# Patient Record
Sex: Female | Born: 1945 | Race: White | Hispanic: No | Marital: Married | Smoking: Never smoker
Health system: Southern US, Community
[De-identification: ages and names within clinical notes are randomized; demographics above are authoritative.]

## PROBLEM LIST (undated history)

## (undated) DIAGNOSIS — E785 Hyperlipidemia, unspecified: Secondary | ICD-10-CM

## (undated) DIAGNOSIS — D649 Anemia, unspecified: Secondary | ICD-10-CM

## (undated) DIAGNOSIS — E119 Type 2 diabetes mellitus without complications: Secondary | ICD-10-CM

## (undated) HISTORY — DX: Hyperlipidemia, unspecified: E78.5

## (undated) HISTORY — DX: Type 2 diabetes mellitus without complications: E11.9

## (undated) HISTORY — DX: Anemia, unspecified: D64.9

---

## 1984-04-13 HISTORY — PX: ABDOMINAL HYSTERECTOMY: SHX81

## 1984-05-10 DIAGNOSIS — O24419 Gestational diabetes mellitus in pregnancy, unspecified control: Secondary | ICD-10-CM

## 1989-08-13 HISTORY — PX: CERVICAL DISC SURGERY: SHX588

## 1996-10-14 HISTORY — PX: LAPAROSCOPIC CHOLECYSTECTOMY: SUR755

## 2002-08-05 ENCOUNTER — Other Ambulatory Visit: Admission: RE | Admit: 2002-08-05 | Discharge: 2002-08-05 | Payer: Self-pay | Admitting: Obstetrics and Gynecology

## 2002-08-19 ENCOUNTER — Encounter: Admission: RE | Admit: 2002-08-19 | Discharge: 2002-11-17 | Payer: Self-pay | Admitting: Internal Medicine

## 2006-09-13 ENCOUNTER — Encounter: Admission: RE | Admit: 2006-09-13 | Discharge: 2006-09-13 | Payer: Self-pay | Admitting: Internal Medicine

## 2009-02-03 IMAGING — CR DG HIP COMPLETE 2+V*R*
2 series · 2 of 2 positions shown · non-contrast
Comparison: None.

CLINICAL DATA: Pain six months - no known injury.
 RIGHT HIP - 2 VIEW:

[view not recorded (1 of 2)]
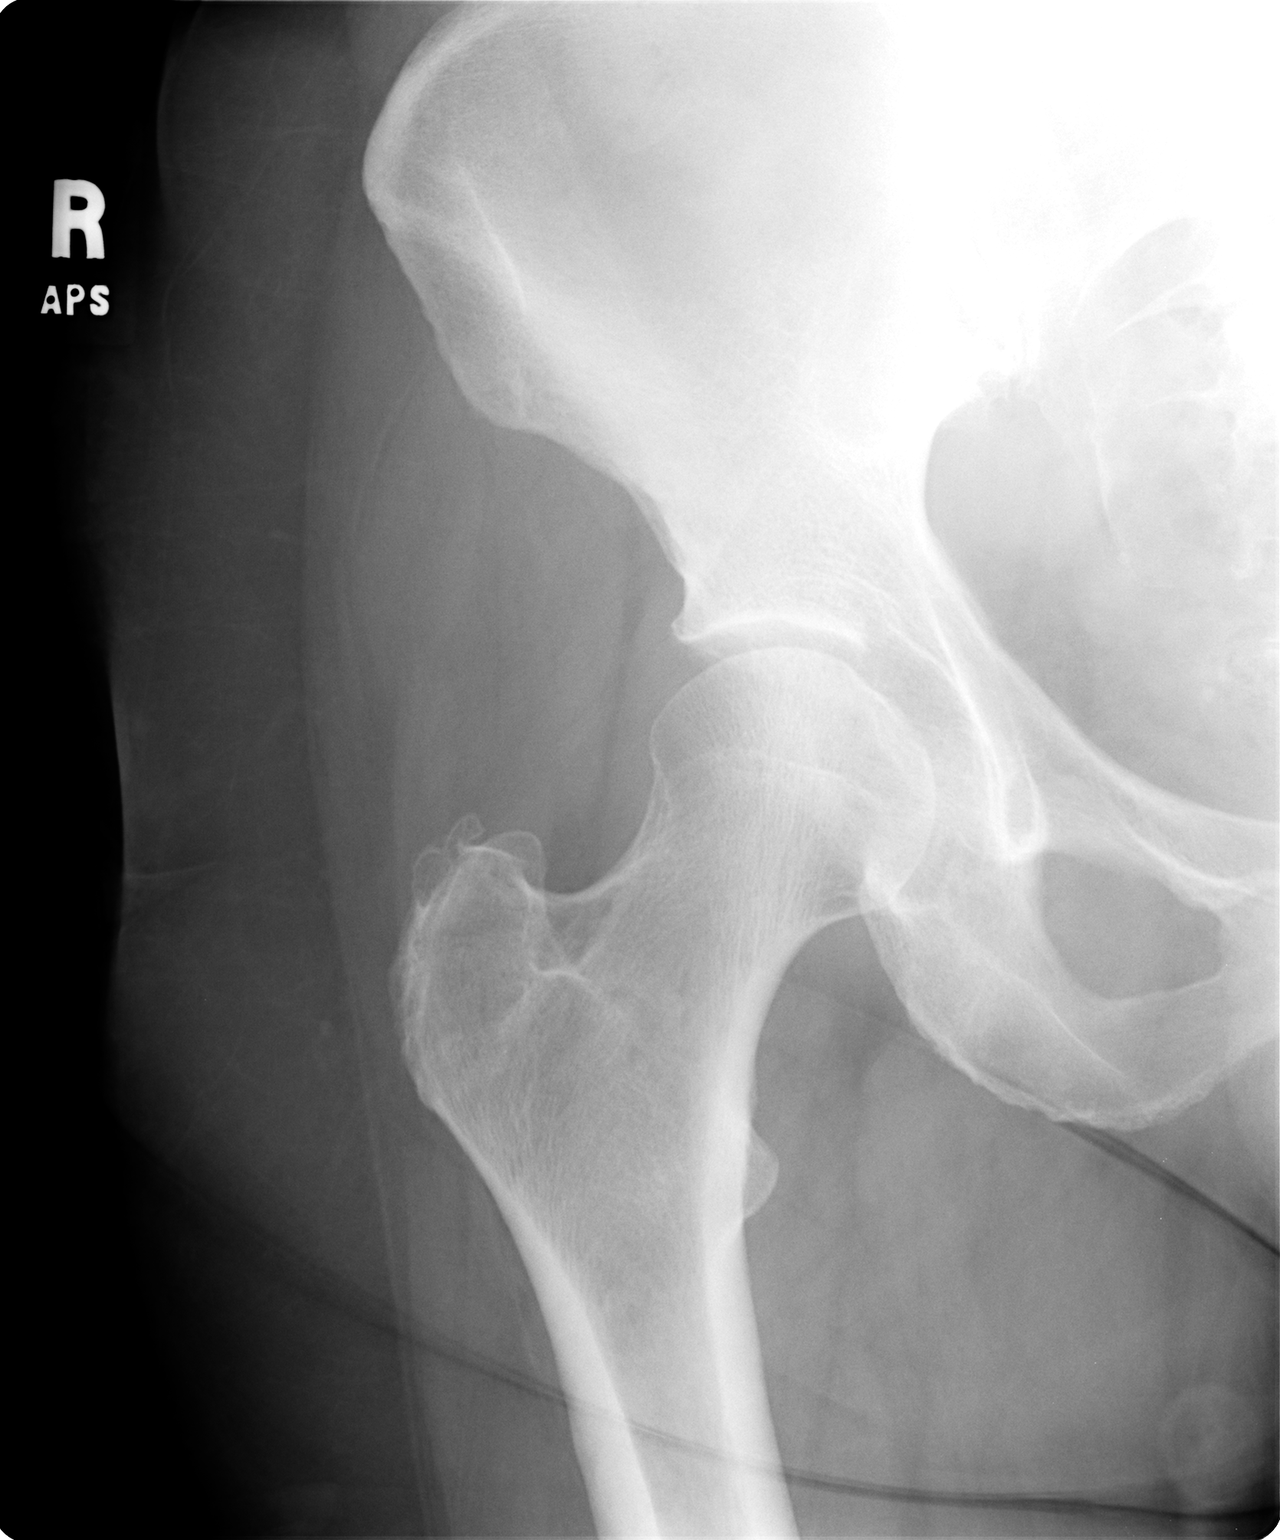

[view not recorded (2 of 2)]
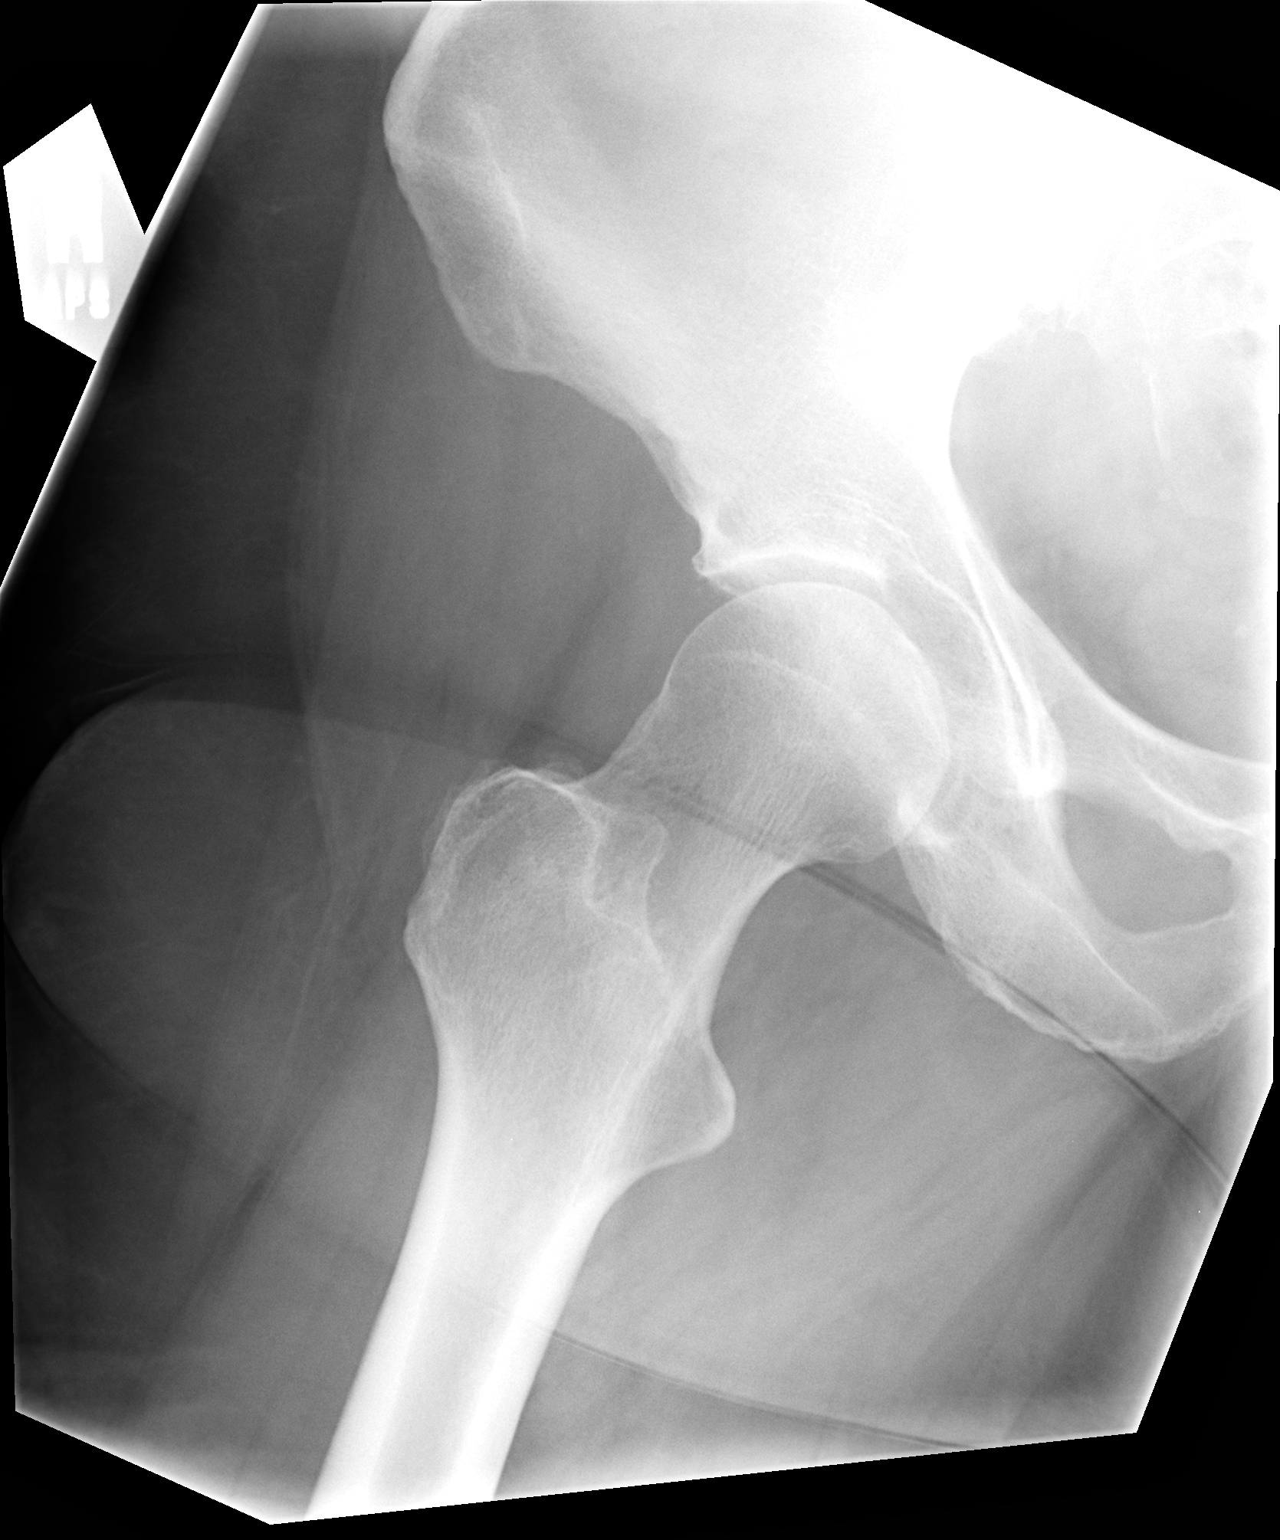

[2 of 2 positions shown; findings below may reference images not displayed]

FINDINGS: The hip is normal.  There are mild degenerative changes of the right SI joint.  No bone lesions evident.
IMPRESSION: 1.  The right hip is radiographically normal. 
 2.  Mild degenerative changes of the SI joint.

## 2013-10-09 ENCOUNTER — Encounter: Payer: Self-pay | Admitting: Nurse Practitioner

## 2013-11-04 ENCOUNTER — Telehealth: Payer: Self-pay | Admitting: Nurse Practitioner

## 2013-11-04 NOTE — Telephone Encounter (Signed)
Left message regarding appointment 11/06/13 has been canceled.

## 2013-11-06 ENCOUNTER — Encounter: Payer: Self-pay | Admitting: Nurse Practitioner

## 2014-04-08 ENCOUNTER — Ambulatory Visit: Payer: Self-pay | Admitting: Podiatrist

## 2014-04-29 ENCOUNTER — Ambulatory Visit (INDEPENDENT_AMBULATORY_CARE_PROVIDER_SITE_OTHER): Payer: Medicare Other | Admitting: Podiatrist

## 2014-04-29 ENCOUNTER — Encounter: Payer: Self-pay | Admitting: Podiatrist

## 2014-04-29 VITALS — BP 142/76 | HR 86 | Resp 12

## 2014-04-29 DIAGNOSIS — B351 Tinea unguium: Secondary | ICD-10-CM

## 2014-04-29 DIAGNOSIS — Z79899 Other long term (current) drug therapy: Secondary | ICD-10-CM

## 2014-04-29 MED ORDER — TERBINAFINE HCL 250 MG PO TABS: 250.0000 mg | ORAL_TABLET | Freq: Every day | ORAL | Status: DC

## 2014-04-29 NOTE — Patient Instructions (Signed)

## 2014-04-29 NOTE — Progress Notes (Signed)
   Subjective:    Patient ID: Kathryn Parker, female    DOB: [birth year], 69 y.o.   MRN: [id number]  HPI PT STATED B/L GREAT TOENAIL ARE THICK AND BEEN PAINFUL FOR 3 YEARS. PT HAVE HISTORY INJURING THE TOENAILS AND THEY BOTH FELL OFF BUT GREW BACK THICK AND GET AGGRAVATED BY WEARING SHOES. TRIED NO TREATMENT.   Review of Systems  Musculoskeletal: Positive for back pain.  All other systems reviewed and are negative.      Objective:   Physical Exam Patient is awake, alert, and oriented x 3.  In no acute distress.  Vascular status is intact with palpable pedal pulses at 2/4 DP and PT bilateral and capillary refill time within normal limits. Neurological sensation is also intact bilaterally via Semmes Weinstein monofilament at 5/5 sites. Light touch, vibratory sensation, Achilles tendon reflex is intact. Dermatological exam reveals skin color, turger and texture as normal. No open lesions present.  Musculature intact with dorsiflexion, plantarflexion, inversion, eversion.  Bilateral hallux naills are severely thick, discolored, dystrophic, painful and clinically mycotic and unresponsive to conservative treatments.    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  Recommended a non premanent removal of bilateral hallux nails and that patient agreed.  Bilateral hallux was prepped with alcohol and a 1 to 1 mix of 0.5% marcaine plain and 2% lidocaine plain was administered in a digital block fashion.  The toe was then prepped with betadine solution and exsanguinated.  The offending nail was then carefully remove.  The area was then cleansed well and antibiotic ointment and a dry sterile dressing was applied.  The patient was dispensed instructions for aftercare. She will start on Lamisil and blood work forms were dispensed.  I also [facility]  Will follow up in a month to check on  the nails and the medication. If any concerns arise in the meantime she is instructed to call.

## 2014-05-01 LAB — HEPATIC FUNCTION PANEL
ALBUMIN: 4.1 g/dL (ref 3.5–5.2)
ALT: 13 U/L (ref 0–35)
AST: 14 U/L (ref 0–37)
Alkaline Phosphatase: 72 U/L (ref 39–117)
BILIRUBIN INDIRECT: 0.6 mg/dL (ref 0.2–1.2)
Bilirubin, Direct: 0.1 mg/dL (ref 0.0–0.3)
TOTAL PROTEIN: 6.9 g/dL (ref 6.0–8.3)
Total Bilirubin: 0.7 mg/dL (ref 0.2–1.2)

## 2014-05-05 ENCOUNTER — Telehealth: Payer: Self-pay

## 2014-05-05 NOTE — Telephone Encounter (Signed)
Left message for pt to call back regarding lab results, her hepatic functions are good and she is cleared to start taking the lamisil

## 2014-05-06 ENCOUNTER — Telehealth: Payer: Self-pay | Admitting: *Deleted

## 2014-05-06 NOTE — Telephone Encounter (Signed)
Pt request lab results.  Dr. Irving ShowsEgerton states the liver function results are within normal ranges and pt may begin the Lamisil.  I informed pt of the results and answered her question concerning pain management of toes after toenail removal procedures and after care, with antiinflammatory medicine and antibacterial cleanses until the areas get a dry hard scab without drainage.  Pt states understanding.

## 2014-06-02 ENCOUNTER — Telehealth: Payer: Self-pay | Admitting: *Deleted

## 2014-06-02 NOTE — Telephone Encounter (Addendum)
Left message informing pt I was calling concerning her labs and to please call our office.  Dr. Irving ShowsEgerton states pt's toenail culture was positive for fungus and her Hepatitic Function test were within normal limits so she could begin the Lamisil as ordered.  Pt called for lab results, I informed her of Dr. Faylene MillionEgerton's orders.

## 2014-11-25 ENCOUNTER — Encounter: Payer: Self-pay | Admitting: Nurse Practitioner

## 2014-11-25 ENCOUNTER — Ambulatory Visit (INDEPENDENT_AMBULATORY_CARE_PROVIDER_SITE_OTHER): Payer: Medicare Other | Admitting: Nurse Practitioner

## 2014-11-25 VITALS — BP 144/80 | HR 60 | Ht 64.5 in | Wt 191.0 lb

## 2014-11-25 DIAGNOSIS — Z0000 Encounter for general adult medical examination without abnormal findings: Secondary | ICD-10-CM

## 2014-11-25 DIAGNOSIS — Z01419 Encounter for gynecological examination (general) (routine) without abnormal findings: Secondary | ICD-10-CM | POA: Diagnosis not present

## 2014-11-25 NOTE — Progress Notes (Signed)
Patient ID: Kathryn Parker, female   DOB: [birth year], 69 y.o.   MRN: [id number] 69 y.o. G71P2002 Married  Caucasian Fe here for NGYN annual exam.  She has a lapse of GYN care for several years.  She is a former pt. and over 3-4 yrs since last seen.  She is now retired from [employer] since January.  They have 3 grandchildren and plans to retire in [place].  Getting their house ready to sell.  Patient's last menstrual period was 07/15/1983 (approximate).          Sexually active: Yes.    The current method of family planning is status post hysterectomy.    Exercising: Yes.    walking Smoker:  no  Health Maintenance: Pap:  Hysterectomy, last pap in paper chart in storage MMG:  >3 years order is given Colonoscopy:  2014, polyps, repeat in 2 years, has received recall letter, IFOB given by Dr. Timothy Lasso 11/19/14 BMD:   ? Order is given TDaP:  2014 Labs:  Dr. Timothy Lasso, have copy of reports and they will be scanned   reports that she has never smoked. She has never used smokeless tobacco. She reports that she does not drink alcohol or use illicit drugs.  Past Medical History  Diagnosis Date  . Diabetes mellitus without complication (HCC)   . Anemia 1985, 1986    heavy menses, hsyterectomy   . Hyperlipidemia     Past Surgical History  Procedure Laterality Date  . Laparoscopic cholecystectomy  10/1996  . Cervical disc surgery  08/1989    C4-C5 Fusion  . Abdominal hysterectomy  04/1984    with c-section    Current Outpatient Prescriptions  Medication Sig Dispense Refill  . aspirin 81 MG EC tablet Take 81 mg by mouth daily. Swallow whole.    . ramipril (ALTACE) 2.5 MG capsule Take 1 capsule by mouth daily.     No current facility-administered medications for this visit.    Family History  Problem Relation Age of Onset  . Cancer Mother     oral, smoker  . COPD Mother   . Hypertension Mother   . Heart attack Father     late 4's and 45  . Osteoporosis Paternal Grandmother     ROS:   Pertinent items are noted in HPI.  Otherwise, a comprehensive ROS was negative.  Exam:   BP 142/86 mmHg  Pulse 60  Ht 5' 4.5" (1.638 m)  Wt 191 lb (86.637 kg)  BMI 32.29 kg/m2  LMP 07/15/1983 (Approximate) Height: 5' 4.5" (163.8 cm) Ht Readings from Last 3 Encounters:  11/25/14 5' 4.5" (1.638 m)    General appearance: alert, cooperative and appears stated age Head: Normocephalic, without obvious abnormality, atraumatic Neck: no adenopathy, supple, symmetrical, trachea midline and thyroid normal to inspection and palpation Lungs: clear to auscultation bilaterally Breasts: normal appearance, no masses or tenderness Heart: regular rate and rhythm Abdomen: soft, non-tender; no masses,  no organomegaly Extremities: extremities normal, atraumatic, no cyanosis or edema Skin: Skin color, texture, turgor normal. No rashes or lesions Lymph nodes: Cervical, supraclavicular, and axillary nodes normal. No abnormal inguinal nodes palpated Neurologic: Grossly normal   Pelvic: External genitalia:  no lesions              Urethra:  normal appearing urethra with no masses, tenderness or lesions              Bartholin's and Skene's: normal  Vagina: normal appearing vagina with normal color and discharge, no lesions              Cervix: absent              Pap taken: No. Bimanual Exam:  Uterus:  uterus absent              Adnexa: no mass, fullness, tenderness               Rectovaginal: Confirms               Anus:  normal sphincter tone, no lesions  Chaperone present: no  A:  Well Woman with normal exam  S/P Abdominal hysterectomy following C Section 04/1984  Postmenopausal no HRT  Borderline DM and controlled with diet  History of HTN  History of hypercholesterolemia and unable to take statins  History of cervical neck DDD   P:   Reviewed health and wellness pertinent to exam  Pap smear as above  Mammogram and BMD - order is given and she will schedule  She will  continue to follow with Dr. Timothy Lassousso for BP - elevated at his office yesterday and he is having her check BP at home  Counseled on breast self exam, mammography screening, adequate intake of calcium and vitamin D, diet and exercise, Kegel's exercises return annually or prn  An After Visit Summary was printed and given to the patient.

## 2014-11-25 NOTE — Patient Instructions (Signed)

## 2014-11-28 NOTE — Progress Notes (Signed)
Encounter reviewed by Dr. Coralyn Roselli Amundson C. Silva.  

## 2015-02-10 ENCOUNTER — Telehealth: Payer: Self-pay | Admitting: Nurse Practitioner

## 2015-02-11 ENCOUNTER — Ambulatory Visit (INDEPENDENT_AMBULATORY_CARE_PROVIDER_SITE_OTHER): Payer: Medicare Other | Admitting: Certified Nurse Midwife

## 2015-02-11 ENCOUNTER — Encounter: Payer: Self-pay | Admitting: Certified Nurse Midwife

## 2015-02-11 VITALS — BP 128/76 | HR 70 | Resp 16 | Ht 64.5 in | Wt 187.0 lb

## 2015-02-11 DIAGNOSIS — B373 Candidiasis of vulva and vagina: Secondary | ICD-10-CM | POA: Diagnosis not present

## 2015-02-11 DIAGNOSIS — B3731 Acute candidiasis of vulva and vagina: Secondary | ICD-10-CM

## 2015-02-11 MED ORDER — NYSTATIN-TRIAMCINOLONE 100000-0.1 UNIT/GM-% EX CREA: 1.0000 "application " | TOPICAL_CREAM | Freq: Two times a day (BID) | CUTANEOUS | Status: DC

## 2015-02-11 MED ORDER — FLUCONAZOLE 150 MG PO TABS: ORAL_TABLET | ORAL | Status: DC

## 2015-02-11 NOTE — Progress Notes (Signed)
69 yo white married g2p2002 female here with complaint of vaginal symptoms of itching, burning, and increase discharge and irritation all the way to the rectum.Marland Kitchen. Describes discharge as white, thick. Patient was treated with antibiotics for strep and now has these symptoms. Patient used Monistat 1 with no change in symptoms.Onset of symptoms 4-5 days ago. Denies new personal products and only slight vaginal dryness. No STD concerns. Urinary symptoms none . No other health issues today.   O:Healthy female WDWN Affect: normal, orientation x 3  Exam:Skin: warm and dry Abdomen: soft, non tender Lymph node: no enlargement or tenderness Pelvic exam: External genital: normal female with edema noted increased pink with scaling and exudate, wet prep taken BUS: negative Bladder, urethral meatus non tender Vagina: white thick discharge noted. Ph: 4.0   ,Wet prep taken,  Cervix: absent Uterus: absent Adnexa: no masses or fullness noted   Wet Prep results:positive for yeast external and vaginal   A:Normal pelvic exam Yeast vaginitis/vulvitis   P:Discussed findings of yeast vaginitis and vulvitis and etiology. Discussed Aveeno or baking soda sitz bath for comfort. Avoid moist clothes or pads for extended period of time. If working out in gym clothes change underweart if possible. Coconut Oil use for skin protection prior to activity can be used to external skin for protection or dryness. Rx: Diflucan see order Rx Mycolog cream see order Questions addressed.   Rv prn

## 2015-02-11 NOTE — Patient Instructions (Signed)

## 2015-02-13 NOTE — Progress Notes (Signed)
Reviewed personally.  M. Suzanne Taeler Winning, MD.  

## 2015-11-29 ENCOUNTER — Encounter: Payer: Self-pay | Admitting: Nurse Practitioner

## 2015-11-29 ENCOUNTER — Ambulatory Visit (INDEPENDENT_AMBULATORY_CARE_PROVIDER_SITE_OTHER): Payer: Medicare Other | Admitting: Nurse Practitioner

## 2015-11-29 VITALS — BP 112/70 | HR 60 | Ht 64.5 in | Wt 177.0 lb

## 2015-11-29 DIAGNOSIS — Z01419 Encounter for gynecological examination (general) (routine) without abnormal findings: Secondary | ICD-10-CM

## 2015-11-29 DIAGNOSIS — Z0000 Encounter for general adult medical examination without abnormal findings: Secondary | ICD-10-CM

## 2015-11-29 NOTE — Progress Notes (Signed)
Patient ID: Kathryn Parker, female   DOB: 14-Aug-1945, 70 y.o.   MRN: [id number]  70 y.o. G2P2002 Married  Caucasian Fe here for annual exam.  Now sold home in [place] and moved to [place].  Son getting married in November.  He is 40 and just got accepted in [place] program.  Continued left knee pain.    Patient's last menstrual period was 07/14/1984.          Sexually active: Yes.    The current method of family planning is status post hysterectomy.    Exercising: Yes.    walking Smoker:  no  Health Maintenance: Pap: Hysterectomy, last pap in paper chart in storage MMG: >3 years order is given - will get when returns to [facility] for another apt. Colonoscopy:  03/2015,  BMD:   ? Order is given TDaP:  2014 Shingles: Dr. Timothy Lasso Pneumonia: done at PCP Hep C: not sure done Labs: done by PCP   reports that she has never smoked. She has never used smokeless tobacco. She reports that she does not drink alcohol or use drugs.  Past Medical History:  Diagnosis Date  . Anemia 1985, 1986   heavy menses, hsyterectomy   . Diabetes mellitus without complication (HCC)   . Hyperlipidemia     Past Surgical History:  Procedure Laterality Date  . ABDOMINAL HYSTERECTOMY  04/1984   with c-section  . CERVICAL DISC SURGERY  08/1989   C4-C5 Fusion  . LAPAROSCOPIC CHOLECYSTECTOMY  10/1996    Current Outpatient Prescriptions  Medication Sig Dispense Refill  . aspirin 81 MG EC tablet Take 81 mg by mouth daily. Swallow whole.    . nystatin-triamcinolone (MYCOLOG II) cream Apply 1 application topically 2 (two) times daily. Apply to affected area BID for up to 7 days. 60 g 0  . ramipril (ALTACE) 2.5 MG capsule Take 1 capsule by mouth daily.     No current facility-administered medications for this visit.     Family History  Problem Relation Age of Onset  . Cancer Mother     oral, smoker  . COPD Mother   . Hypertension Mother   . Heart attack Father     late 20's and 81  . Osteoporosis Paternal  Grandmother     ROS:  Pertinent items are noted in HPI.  Otherwise, a comprehensive ROS was negative.  Exam:   BP 112/70 (BP Location: Right Arm, Patient Position: Sitting, Cuff Size: Large)   Pulse 60   Ht 5' 4.5" (1.638 m)   Wt 177 lb (80.3 kg)   LMP 07/14/1984   BMI 29.91 kg/m  Height: 5' 4.5" (163.8 cm) Ht Readings from Last 3 Encounters:  11/29/15 5' 4.5" (1.638 m)  02/11/15 5' 4.5" (1.638 m)  11/25/14 5' 4.5" (1.638 m)    General appearance: alert, cooperative and appears stated age Head: Normocephalic, without obvious abnormality, atraumatic Neck: no adenopathy, supple, symmetrical, trachea midline and thyroid normal to inspection and palpation Lungs: clear to auscultation bilaterally Breasts: normal appearance, no masses or tenderness Heart: regular rate and rhythm Abdomen: soft, non-tender; no masses,  no organomegaly Extremities: extremities normal, atraumatic, no cyanosis or edema Skin: Skin color, texture, turgor normal. No rashes or lesions Lymph nodes: Cervical, supraclavicular, and axillary nodes normal. No abnormal inguinal nodes palpated Neurologic: Grossly normal   Pelvic: External genitalia:  no lesions              Urethra:  normal appearing urethra with no masses, tenderness or lesions  Bartholin's and Skene's: normal                 Vagina: normal appearing vagina with normal color and discharge, no lesions              Cervix: absent              Pap taken: No. Bimanual Exam:  Uterus:  uterus absent              Adnexa: no mass, fullness, tenderness               Rectovaginal: Confirms               Anus:  normal sphincter tone, no lesions  Chaperone present: yes  A:  Well Woman with normal exam       S/P Abdominal hysterectomy following C Section 04/1984             Postmenopausal no HRT             Borderline DM and controlled with diet             History of HTN             History of hypercholesterolemia and unable to take  statins             History of cervical neck DDD   P:   Reviewed health and wellness pertinent to exam  Pap smear as above  Mammogram is due now and will scheduled  return annually or prn  An After Visit Summary was printed and given to the patient.

## 2015-11-29 NOTE — Patient Instructions (Addendum)

## 2015-12-05 NOTE — Progress Notes (Signed)
Encounter reviewed by Dr. Brook Amundson C. Silva.  

## 2018-10-01 ENCOUNTER — Other Ambulatory Visit: Payer: Self-pay | Admitting: Internal Medicine

## 2018-10-01 DIAGNOSIS — R7989 Other specified abnormal findings of blood chemistry: Secondary | ICD-10-CM
